# Patient Record
Sex: Female | Born: 1985 | Race: Black or African American | Hispanic: No | Marital: Single | Smoking: Current every day smoker
Health system: Southern US, Community
[De-identification: ages and names within clinical notes are randomized; demographics above are authoritative.]

## PROBLEM LIST (undated history)

## (undated) DIAGNOSIS — F1210 Cannabis abuse, uncomplicated: Secondary | ICD-10-CM

## (undated) DIAGNOSIS — Z789 Other specified health status: Secondary | ICD-10-CM

## (undated) HISTORY — PX: NO PAST SURGERIES: SHX2092

---

## 2009-06-01 ENCOUNTER — Emergency Department: Payer: Self-pay | Admitting: Emergency Medicine

## 2010-01-04 ENCOUNTER — Emergency Department: Payer: Self-pay | Admitting: Emergency Medicine

## 2010-04-28 ENCOUNTER — Emergency Department: Payer: Self-pay | Admitting: Emergency Medicine

## 2010-04-30 ENCOUNTER — Emergency Department: Payer: Self-pay | Admitting: Internal Medicine

## 2010-11-25 ENCOUNTER — Emergency Department: Payer: Self-pay | Admitting: Emergency Medicine

## 2011-05-29 ENCOUNTER — Emergency Department: Payer: Self-pay | Admitting: Emergency Medicine

## 2011-08-15 ENCOUNTER — Emergency Department: Payer: Self-pay | Admitting: Emergency Medicine

## 2011-10-14 ENCOUNTER — Ambulatory Visit: Payer: Self-pay | Admitting: Advanced Practice Midwife

## 2012-02-16 ENCOUNTER — Observation Stay: Payer: Self-pay

## 2012-03-18 ENCOUNTER — Observation Stay: Payer: Self-pay | Admitting: Obstetrics and Gynecology

## 2012-03-19 ENCOUNTER — Inpatient Hospital Stay: Payer: Self-pay | Admitting: Obstetrics and Gynecology

## 2012-03-19 LAB — CBC WITH DIFFERENTIAL/PLATELET
Basophil #: 0 10*3/uL (ref 0.0–0.1)
Basophil %: 0.1 %
Eosinophil #: 0 10*3/uL (ref 0.0–0.7)
HCT: 36.7 % (ref 35.0–47.0)
Lymphocyte #: 0.7 10*3/uL — ABNORMAL LOW (ref 1.0–3.6)
Lymphocyte %: 7.7 %
MCH: 31.4 pg (ref 26.0–34.0)
MCHC: 34.2 g/dL (ref 32.0–36.0)
MCV: 92 fL (ref 80–100)
Monocyte #: 0.7 x10 3/mm (ref 0.2–0.9)
Neutrophil %: 84.3 %
Platelet: 243 10*3/uL (ref 150–440)
RBC: 3.99 10*6/uL (ref 3.80–5.20)
WBC: 8.8 10*3/uL (ref 3.6–11.0)

## 2012-03-20 LAB — HEMATOCRIT: HCT: 35 % (ref 35.0–47.0)

## 2012-12-15 ENCOUNTER — Emergency Department: Payer: Self-pay | Admitting: Emergency Medicine

## 2013-03-20 IMAGING — US US OB US >=[ID] SNGL FETUS
1 series · 17 of 28 positions shown · non-contrast
Comparison: none

REASON FOR EXAM: Dating
COMMENTS:

[Series 1: us ob us >=(id) sngl fetus · 17 of 63 slices shown]
[im 1/63]
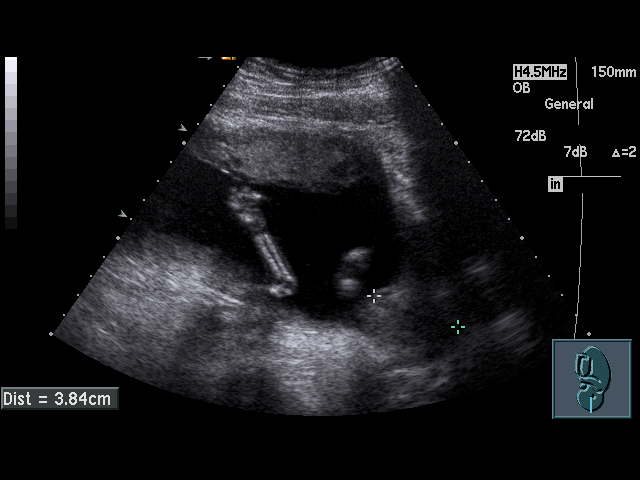
[im 5/63]
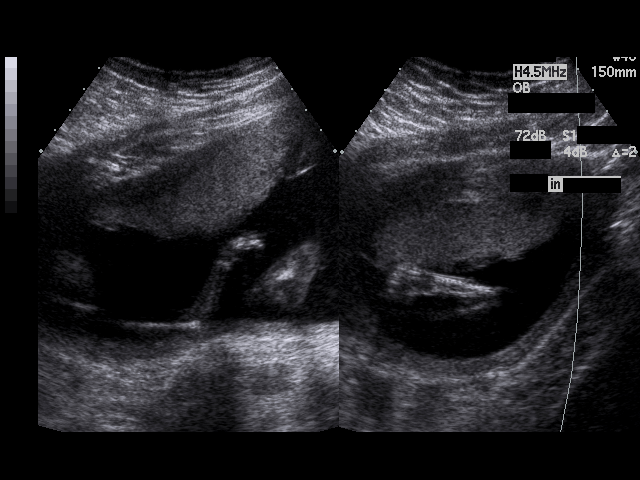
[im 10/63]
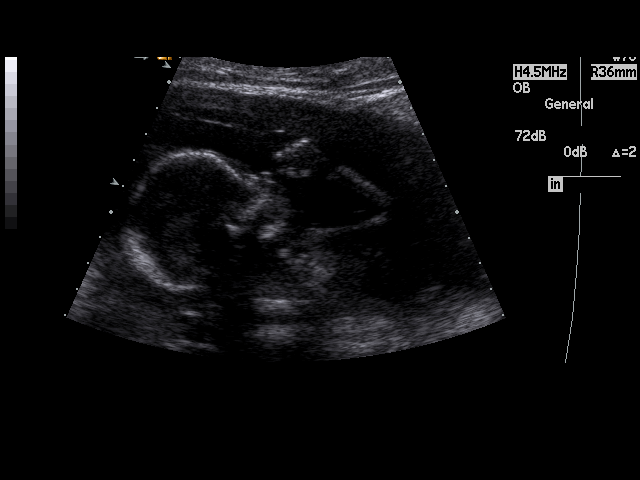
[im 12/63]
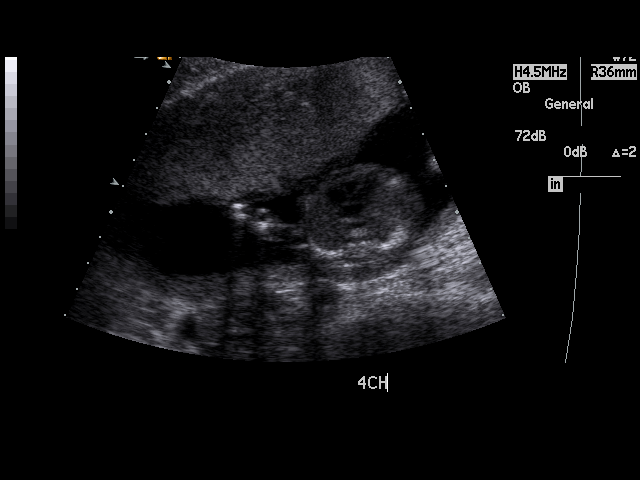
[im 17/63]
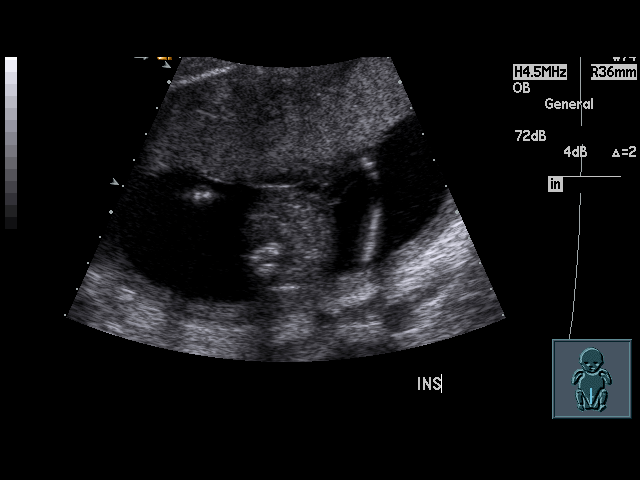
[im 21/63]
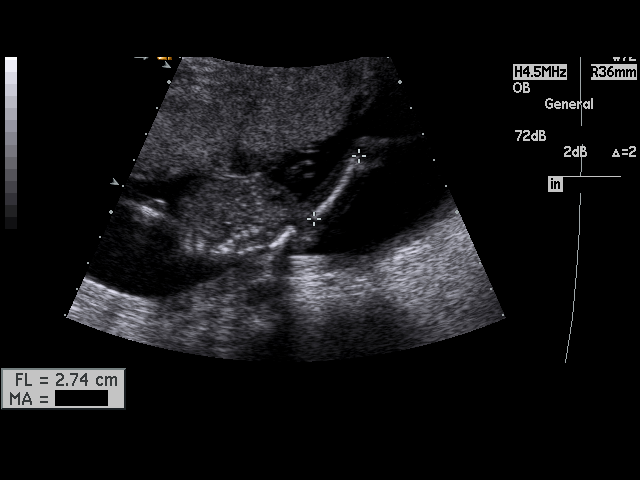
[im 23/63]
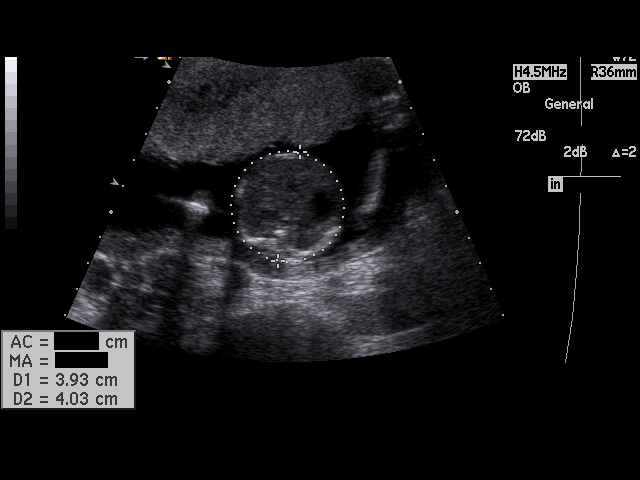
[im 28/63]
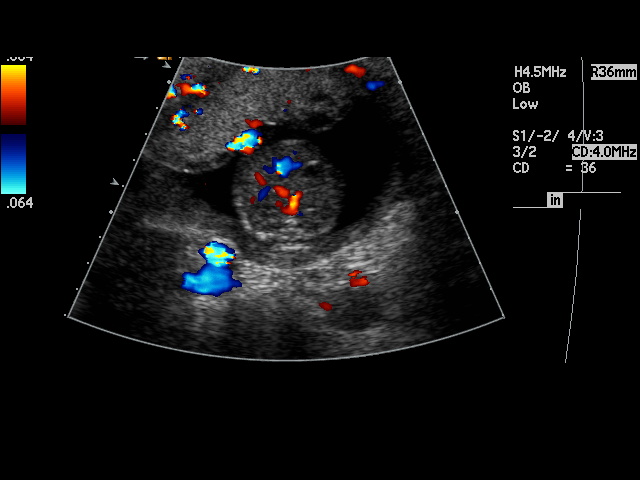
[im 33/63]
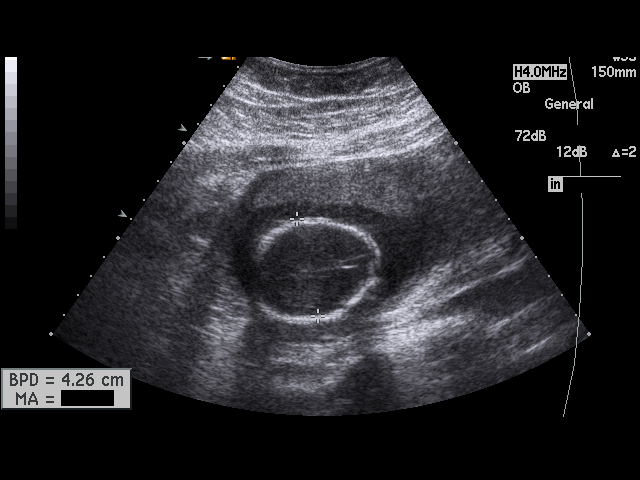
[im 35/63]
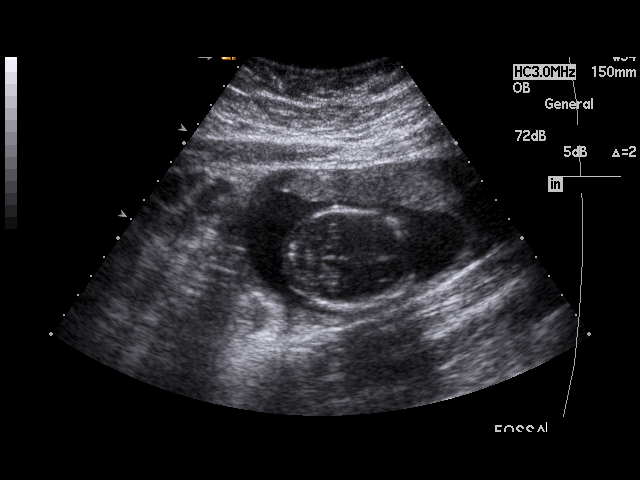
[im 40/63]
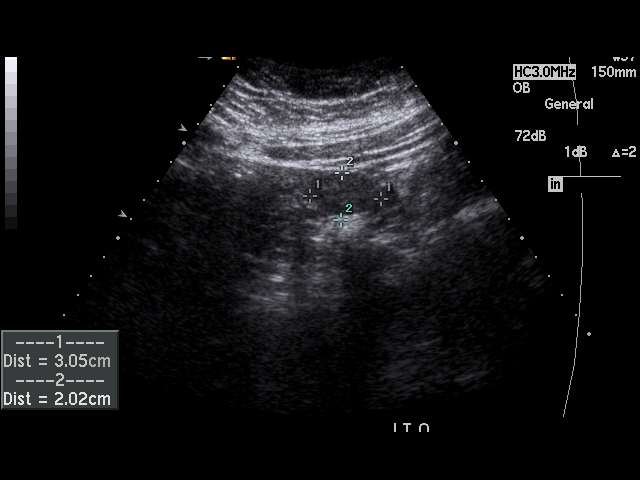
[im 42/63]
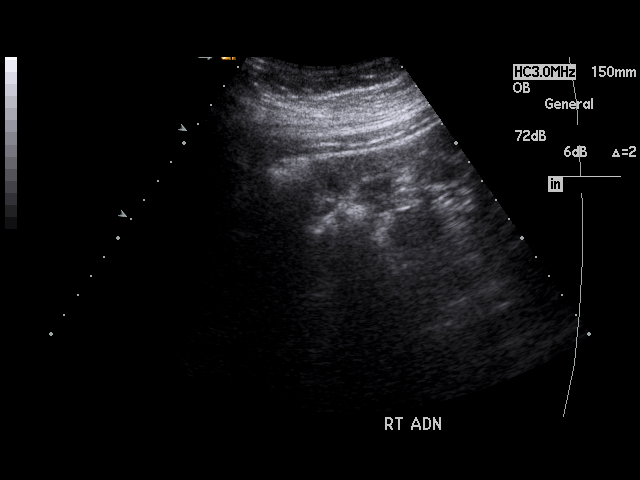
[im 46/63]
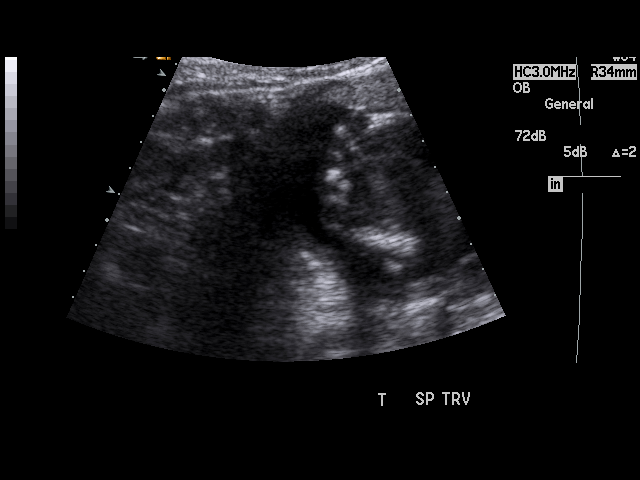
[im 51/63]
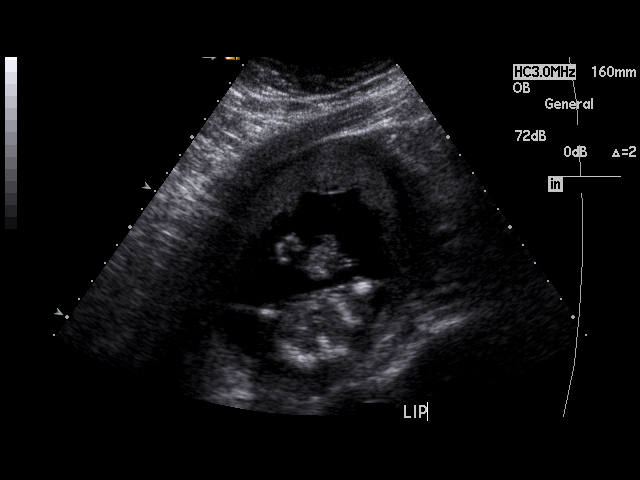
[im 53/63]
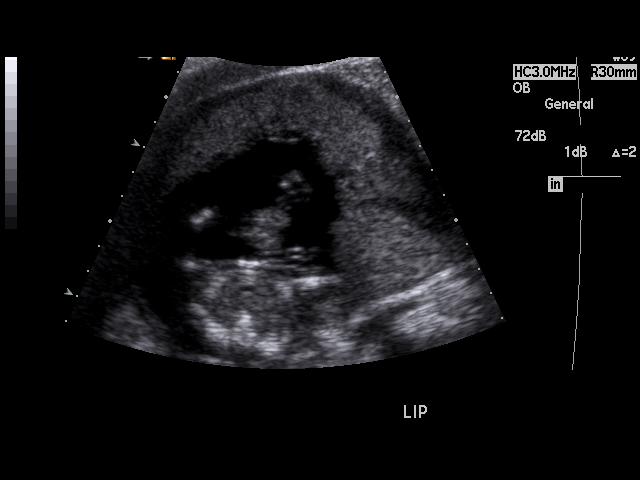
[im 58/63]
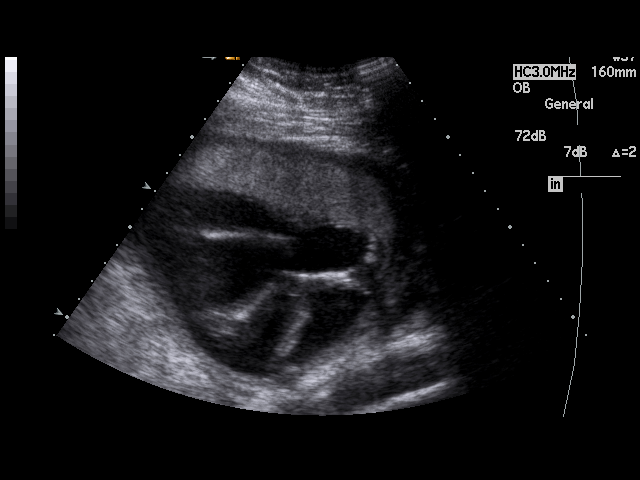
[im 63/63]
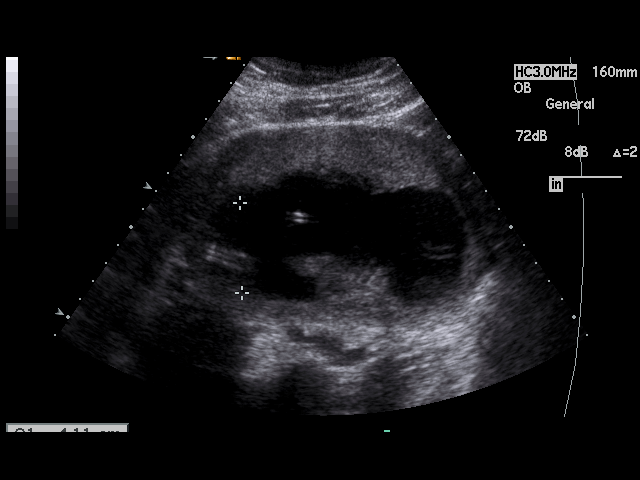

[17 of 28 positions shown; findings below may reference images not displayed]

PROCEDURE:     US  - US OB GREATER/OR EQUAL TO 7MSB9  - October 14, 2011  [DATE]

RESULT:     Findings: A single living intrauterine gestation is observed.
Presentation currently is breech. The placenta is anterior measuring 5.1 cm
from the cervical os. The fetal heart rate was monitored at 150 beats per
minute. A normal appearing three vessel cord is seen. No hydrocephalus or
hydronephrosis is noted. The bladder, kidneys, stomach, heart, diaphragm,
spine and ventricles measured no focal abnormality. A 4 chambered heart is
visualized. The RVOT and LVOT are suboptimally assess.

Fetal measurements are as follows:
BPD 4.23 cm 18 weeks 6 days
HC   15.75 cm 18 weeks 4 days
AC   12.5 cm 18 weeks 1 days
FL    2.79 cm 18 weeks 4 days

EFW equal 259 grams. Average ultrasound age is 18 weeks 4 days. Ultrasound
EDD is 03/12/2012.
IMPRESSION: See above.

## 2013-07-07 ENCOUNTER — Emergency Department: Payer: Self-pay | Admitting: Emergency Medicine

## 2013-07-07 LAB — COMPREHENSIVE METABOLIC PANEL
Albumin: 3.7 g/dL (ref 3.4–5.0)
Anion Gap: 3 — ABNORMAL LOW (ref 7–16)
Co2: 28 mmol/L (ref 21–32)
Creatinine: 1.07 mg/dL (ref 0.60–1.30)
EGFR (African American): >60
Osmolality: 275 (ref 275–301)
SGOT(AST): 21 U/L (ref 15–37)
Sodium: 138 mmol/L (ref 136–145)
Total Protein: 8.2 g/dL (ref 6.4–8.2)

## 2013-07-07 LAB — URINALYSIS, COMPLETE
Bilirubin,UR: NEGATIVE
Blood: NEGATIVE
Glucose,UR: NEGATIVE mg/dL (ref 0–75)
Nitrite: NEGATIVE
Ph: 6 (ref 4.5–8.0)
RBC,UR: <1 /HPF (ref 0–5)
Specific Gravity: 1.014 (ref 1.003–1.030)
Squamous Epithelial: 1

## 2013-07-07 LAB — CBC WITH DIFFERENTIAL/PLATELET
Basophil #: 0 10*3/uL (ref 0.0–0.1)
Basophil %: 0.5 %
Eosinophil #: 0 10*3/uL (ref 0.0–0.7)
Eosinophil %: 0.4 %
HGB: 14.1 g/dL (ref 12.0–16.0)
Lymphocyte #: 1.2 10*3/uL (ref 1.0–3.6)
Lymphocyte %: 16.9 %
MCH: 30.1 pg (ref 26.0–34.0)
MCHC: 33.3 g/dL (ref 32.0–36.0)
MCV: 90 fL (ref 80–100)
Monocyte #: 0.5 x10 3/mm (ref 0.2–0.9)
Neutrophil #: 5.4 10*3/uL (ref 1.4–6.5)
Neutrophil %: 75.7 %
Platelet: 236 10*3/uL (ref 150–440)
RBC: 4.68 10*6/uL (ref 3.80–5.20)
RDW: 13.2 % (ref 11.5–14.5)

## 2013-07-07 LAB — LIPASE, BLOOD: Lipase: 170 U/L (ref 73–393)

## 2014-06-22 ENCOUNTER — Emergency Department: Payer: Self-pay | Admitting: Student

## 2014-12-03 NOTE — H&P (Signed)
L&D Evaluation:  History:   HPI 29yo G1 at 8082w1d by D=20wk U/S presents with c/o ctx.  Scheduled for induction for postdates this evening.  Overnight pt has had regular contractions and made cervical change from 1 to 3cm.  Recent leakage of meconium stained fluid.    Presents with contractions    Patient's Medical History No Chronic Illness    Patient's Surgical History none    Medications Pre Natal Vitamins    Allergies NKDA    Social History tobacco  1ppd    Family History Non-Contributory   ROS:   ROS All systems were reviewed.  HEENT, CNS, GI, GU, Respiratory, CV, Renal and Musculoskeletal systems were found to be normal.   Exam:   Vital Signs stable    General mild distress with contractions    Mental Status clear    Chest clear    Heart normal sinus rhythm    Abdomen gravid, non-tender    Estimated Fetal Weight Average for gestational age    Pelvic cervix 3cm    Mebranes Ruptured    Description green/meconium    FHT normal rate with no decels    FHT Description 130 mod variability, + accels, no decels    Ucx regular   Impression:   Impression early labor, near post-dates   Plan:   Plan EFM/NST, monitor contractions and for cervical change, antibiotics for GBBS prophylaxis, augment as needed, pain meds/epidural prn pain   Electronic Signatures: Orvan FalconerStansbury Clipp, Dawson Hollman K (MD)  (Signed 25-Aug-13 08:01)  Authored: L&D Evaluation   Last Updated: 25-Aug-13 08:01 by Garnette GunnerStansbury Clipp, Ali LoweEryn K (MD)

## 2019-07-27 NOTE — L&D Delivery Note (Signed)
Delivery Note At 3:12 PM a  viable female was delivered via Vaginal, Spontaneous (Presentation: ROA).  APGAR: 8, 9; weight 5 lb 2.9 oz (2350 g).   Placenta status: Spontaneous, Intact.  Cord: 3 vessels with the following complications: None.  Cord pH: N/A  Anesthesia: None Episiotomy: None Lacerations: None Suture Repair: N/A Est. Blood Loss (mL):   Mom to postpartum.  Baby to Couplet care / Skin to Skin.  Vena Austria 07/12/2020, 4:15 PM

## 2019-09-14 ENCOUNTER — Ambulatory Visit: Payer: Self-pay

## 2020-07-12 ENCOUNTER — Other Ambulatory Visit: Payer: Self-pay

## 2020-07-12 ENCOUNTER — Inpatient Hospital Stay
Admission: EM | Admit: 2020-07-12 | Discharge: 2020-07-14 | DRG: 805 | Disposition: A | Discharge status: Home / Self Care | Payer: Medicaid Other | Attending: Obstetrics and Gynecology | Admitting: Obstetrics and Gynecology

## 2020-07-12 ENCOUNTER — Encounter: Payer: Self-pay | Admitting: Emergency Medicine

## 2020-07-12 ENCOUNTER — Emergency Department: Payer: Medicaid Other

## 2020-07-12 DIAGNOSIS — R109 Unspecified abdominal pain: Secondary | ICD-10-CM | POA: Diagnosis not present

## 2020-07-12 DIAGNOSIS — Z3A31 31 weeks gestation of pregnancy: Secondary | ICD-10-CM

## 2020-07-12 DIAGNOSIS — O99334 Smoking (tobacco) complicating childbirth: Secondary | ICD-10-CM | POA: Diagnosis present

## 2020-07-12 DIAGNOSIS — F17210 Nicotine dependence, cigarettes, uncomplicated: Secondary | ICD-10-CM | POA: Diagnosis present

## 2020-07-12 DIAGNOSIS — Z20822 Contact with and (suspected) exposure to covid-19: Secondary | ICD-10-CM | POA: Diagnosis present

## 2020-07-12 HISTORY — DX: Other specified health status: Z78.9

## 2020-07-12 HISTORY — DX: Cannabis abuse, uncomplicated: F12.10

## 2020-07-12 LAB — CBC WITH DIFFERENTIAL/PLATELET
Abs Immature Granulocytes: 0.03 10*3/uL (ref 0.00–0.07)
Basophils Absolute: 0 10*3/uL (ref 0.0–0.1)
Basophils Relative: 0 %
Eosinophils Absolute: 0 10*3/uL (ref 0.0–0.5)
Eosinophils Relative: 0 %
HCT: 37.8 % (ref 36.0–46.0)
Hemoglobin: 12.9 g/dL (ref 12.0–15.0)
Immature Granulocytes: 0 %
Lymphocytes Relative: 7 %
Lymphs Abs: 0.5 10*3/uL — ABNORMAL LOW (ref 0.7–4.0)
MCH: 32.2 pg (ref 26.0–34.0)
MCHC: 34.1 g/dL (ref 30.0–36.0)
MCV: 94.3 fL (ref 80.0–100.0)
Monocytes Absolute: 0.6 10*3/uL (ref 0.1–1.0)
Monocytes Relative: 8 %
Neutro Abs: 6 10*3/uL (ref 1.7–7.7)
Neutrophils Relative %: 85 %
Platelets: 101 10*3/uL — ABNORMAL LOW (ref 150–400)
RBC: 4.01 MIL/uL (ref 3.87–5.11)
RDW: 12.5 % (ref 11.5–15.5)
WBC: 7.1 10*3/uL (ref 4.0–10.5)
nRBC: 0 % (ref 0.0–0.2)

## 2020-07-12 LAB — URINALYSIS, COMPLETE (UACMP) WITH MICROSCOPIC
Bacteria, UA: NONE SEEN
Bilirubin Urine: NEGATIVE
Glucose, UA: NEGATIVE mg/dL
Ketones, ur: 80 mg/dL — AB
Nitrite: NEGATIVE
Protein, ur: >=300 mg/dL — AB
Specific Gravity, Urine: 1.02 (ref 1.005–1.030)
pH: 5 (ref 5.0–8.0)

## 2020-07-12 LAB — URINE DRUG SCREEN, QUALITATIVE (ARMC ONLY)
Amphetamines, Ur Screen: NOT DETECTED
Barbiturates, Ur Screen: NOT DETECTED
Benzodiazepine, Ur Scrn: NOT DETECTED
Cannabinoid 50 Ng, Ur ~~LOC~~: POSITIVE — AB
Cocaine Metabolite,Ur ~~LOC~~: POSITIVE — AB
MDMA (Ecstasy)Ur Screen: NOT DETECTED
Methadone Scn, Ur: NOT DETECTED
Opiate, Ur Screen: NOT DETECTED
Phencyclidine (PCP) Ur S: NOT DETECTED
Tricyclic, Ur Screen: NOT DETECTED

## 2020-07-12 LAB — COMPREHENSIVE METABOLIC PANEL
ALT: 14 U/L (ref 0–44)
AST: 24 U/L (ref 15–41)
Albumin: 3 g/dL — ABNORMAL LOW (ref 3.5–5.0)
Alkaline Phosphatase: 116 U/L (ref 38–126)
Anion gap: 11 (ref 5–15)
BUN: <5 mg/dL — ABNORMAL LOW (ref 6–20)
CO2: 24 mmol/L (ref 22–32)
Calcium: 8.8 mg/dL — ABNORMAL LOW (ref 8.9–10.3)
Chloride: 99 mmol/L (ref 98–111)
Creatinine, Ser: 0.55 mg/dL (ref 0.44–1.00)
GFR, Estimated: >60 mL/min (ref >60)
Glucose, Bld: 116 mg/dL — ABNORMAL HIGH (ref 70–99)
Potassium: 3.6 mmol/L (ref 3.5–5.1)
Sodium: 134 mmol/L — ABNORMAL LOW (ref 135–145)
Total Bilirubin: 0.7 mg/dL (ref 0.3–1.2)
Total Protein: 7.3 g/dL (ref 6.5–8.1)

## 2020-07-12 LAB — RESP PANEL BY RT-PCR (FLU A&B, COVID) ARPGX2
Influenza A by PCR: NEGATIVE
Influenza B by PCR: NEGATIVE
SARS Coronavirus 2 by RT PCR: NEGATIVE

## 2020-07-12 LAB — GROUP B STREP BY PCR: Group B strep by PCR: POSITIVE — AB

## 2020-07-12 LAB — POCT PREGNANCY, URINE: Preg Test, Ur: POSITIVE — AB

## 2020-07-12 LAB — RAPID HIV SCREEN (HIV 1/2 AB+AG)
HIV 1/2 Antibodies: NONREACTIVE
HIV-1 P24 Antigen - HIV24: NONREACTIVE

## 2020-07-12 LAB — OB RESULTS CONSOLE HIV ANTIBODY (ROUTINE TESTING): HIV: NONREACTIVE

## 2020-07-12 LAB — HEPATITIS B SURFACE ANTIGEN: Hepatitis B Surface Ag: NONREACTIVE

## 2020-07-12 LAB — TYPE AND SCREEN
ABO/RH(D): O POS
Antibody Screen: NEGATIVE

## 2020-07-12 LAB — ABO/RH: ABO/RH(D): O POS

## 2020-07-12 LAB — OB RESULTS CONSOLE GBS: GBS: POSITIVE

## 2020-07-12 LAB — OB RESULTS CONSOLE HEPATITIS B SURFACE ANTIGEN: Hepatitis B Surface Ag: NEGATIVE

## 2020-07-12 MED ORDER — OXYTOCIN 10 UNIT/ML IJ SOLN
INTRAMUSCULAR | Status: AC
  Administered 2020-07-12: 10 [IU] via INTRAMUSCULAR
  Filled 2020-07-12: qty 2

## 2020-07-12 MED ORDER — IBUPROFEN 600 MG PO TABS
600.0000 mg | ORAL_TABLET | Freq: Four times a day (QID) | ORAL | Status: DC
  Administered 2020-07-12 – 2020-07-14 (×9): 600 mg via ORAL
  Filled 2020-07-12 (×7): qty 1

## 2020-07-12 MED ORDER — SIMETHICONE 80 MG PO CHEW: 80.0000 mg | CHEWABLE_TABLET | ORAL | Status: DC | PRN

## 2020-07-12 MED ORDER — WITCH HAZEL-GLYCERIN EX PADS: 1.0000 "application " | MEDICATED_PAD | CUTANEOUS | Status: DC | PRN

## 2020-07-12 MED ORDER — DIPHENHYDRAMINE HCL 25 MG PO CAPS: 25.0000 mg | ORAL_CAPSULE | Freq: Four times a day (QID) | ORAL | Status: DC | PRN

## 2020-07-12 MED ORDER — MISOPROSTOL 200 MCG PO TABS
ORAL_TABLET | ORAL | Status: AC
  Filled 2020-07-12: qty 4

## 2020-07-12 MED ORDER — PRENATAL MULTIVITAMIN CH
1.0000 | ORAL_TABLET | Freq: Every day | ORAL | Status: DC
  Administered 2020-07-13 – 2020-07-14 (×2): 1 via ORAL
  Filled 2020-07-12 (×2): qty 1

## 2020-07-12 MED ORDER — ONDANSETRON HCL 4 MG PO TABS
4.0000 mg | ORAL_TABLET | ORAL | Status: DC | PRN
  Filled 2020-07-12: qty 1

## 2020-07-12 MED ORDER — OXYCODONE-ACETAMINOPHEN 5-325 MG PO TABS: 1.0000 | ORAL_TABLET | ORAL | Status: DC | PRN

## 2020-07-12 MED ORDER — ONDANSETRON HCL 4 MG/2ML IJ SOLN: 4.0000 mg | INTRAMUSCULAR | Status: DC | PRN

## 2020-07-12 MED ORDER — TETANUS-DIPHTH-ACELL PERTUSSIS 5-2.5-18.5 LF-MCG/0.5 IM SUSY: 0.5000 mL | PREFILLED_SYRINGE | Freq: Once | INTRAMUSCULAR | Status: DC

## 2020-07-12 MED ORDER — OXYCODONE-ACETAMINOPHEN 5-325 MG PO TABS
2.0000 | ORAL_TABLET | ORAL | Status: DC | PRN
Start: 2020-07-12 — End: 2020-07-15

## 2020-07-12 MED ORDER — AMMONIA AROMATIC IN INHA
RESPIRATORY_TRACT | Status: AC
  Filled 2020-07-12: qty 10

## 2020-07-12 MED ORDER — DIBUCAINE (PERIANAL) 1 % EX OINT: 1.0000 "application " | TOPICAL_OINTMENT | CUTANEOUS | Status: DC | PRN

## 2020-07-12 MED ORDER — SENNOSIDES-DOCUSATE SODIUM 8.6-50 MG PO TABS
2.0000 | ORAL_TABLET | ORAL | Status: DC
  Administered 2020-07-13 – 2020-07-14 (×2): 2 via ORAL
  Filled 2020-07-12 (×2): qty 2

## 2020-07-12 MED ORDER — INFLUENZA VAC SPLIT QUAD 0.5 ML IM SUSY
0.5000 mL | PREFILLED_SYRINGE | INTRAMUSCULAR | Status: DC
  Filled 2020-07-12: qty 0.5

## 2020-07-12 MED ORDER — BENZOCAINE-MENTHOL 20-0.5 % EX AERO
1.0000 "application " | INHALATION_SPRAY | CUTANEOUS | Status: DC | PRN
  Administered 2020-07-14: 1 via TOPICAL
  Filled 2020-07-12: qty 56

## 2020-07-12 MED ORDER — ACETAMINOPHEN 325 MG PO TABS
650.0000 mg | ORAL_TABLET | ORAL | Status: DC | PRN
  Administered 2020-07-12: 650 mg via ORAL
  Filled 2020-07-12: qty 2

## 2020-07-12 MED ORDER — COCONUT OIL OIL: 1.0000 "application " | TOPICAL_OIL | Status: DC | PRN

## 2020-07-12 MED ORDER — LIDOCAINE HCL (PF) 1 % IJ SOLN
INTRAMUSCULAR | Status: AC
  Filled 2020-07-12: qty 30

## 2020-07-12 NOTE — Discharge Summary (Addendum)
OB Discharge Summary     Patient Name: Sharon Alexander DOB: 01-Oct-1985 MRN: [id number]  Date of admission: 07/12/2020 Delivering MD: Vena Austria Date of Delivery: 07/12/2020 Date of discharge: 07/14/2020  Admitting diagnosis: Precipitous delivery [O62.3] Intrauterine pregnancy: Unknown     Secondary diagnosis:  Active Problems:   Precipitous delivery   Single liveborn infant delivered vaginally   Postpartum care following vaginal delivery  Additional problems: No prenatal care     Discharge diagnosis: Term Pregnancy Delivered                                                                                                Post partum procedures: none  Augmentation: N/A  Complications: None  Hospital course:  Onset of Labor With Vaginal Delivery      34 y.o. yo No obstetric history on file. at Unknown was admitted in Active Labor on 07/12/2020. Patient had an uncomplicated labor course as follows:  Membrane Rupture Time/Date: 3:08 PM ,07/12/2020   Delivery Method:Vaginal, Spontaneous  Episiotomy: None  Lacerations:  None  Patient had an uncomplicated postpartum course.  She is tolerating a regular diet. Her pain is well controlled with PO medication She is ambulating and voiding without difficulty.   Patient is discharged home in stable condition on 07/14/20.  Newborn Data: Birth date:07/12/2020  Birth time:3:12 PM  Gender:Female  Living status:Living  Apgars:8 ,9  Weight:2350 g    Physical exam  Vitals:   07/13/20 1115 07/13/20 1612 07/13/20 2316 07/14/20 0815  BP: 110/61 117/74 110/69 105/75  Pulse: 83 83 77 64  Resp: 20 18 18 20   Temp: 98.2 F (36.8 C) 98 F (36.7 C) 98 F (36.7 C) 99 F (37.2 C)  TempSrc: Oral Oral Oral Oral  SpO2:   100% 100%  Weight:      Height:       General: alert, cooperative and no distress Lochia: appropriate Uterine Fundus: firm Incision: N/A DVT Evaluation: No evidence of DVT seen on physical exam. Labs: Lab  Results  Component Value Date   WBC 4.9 07/13/2020   HGB 11.9 (L) 07/13/2020   HCT 34.7 (L) 07/13/2020   MCV 94.0 07/13/2020   PLT 108 (L) 07/13/2020   CMP Latest Ref Rng & Units 07/12/2020  Glucose 70 - 99 mg/dL 07/14/2020)  BUN 6 - 20 mg/dL 409(W)  Creatinine <1(X - 1.00 mg/dL 9.14  Sodium 7.82 - 956 mmol/L 134(L)  Potassium 3.5 - 5.1 mmol/L 3.6  Chloride 98 - 111 mmol/L 99  CO2 22 - 32 mmol/L 24  Calcium 8.9 - 10.3 mg/dL 213)  Total Protein 6.5 - 8.1 g/dL 7.3  Total Bilirubin 0.3 - 1.2 mg/dL 0.7  Alkaline Phos 38 - 126 U/L 116  AST 15 - 41 U/L 24  ALT 0 - 44 U/L 14    Discharge instruction: per After Visit Summary and "Baby and Me Booklet".  After visit meds:  Allergies as of 07/14/2020   No Known Allergies     Medication List    STOP taking these medications   simethicone 125 MG chewable tablet Commonly  known as: MYLICON       Diet: routine diet  Activity: Advance as tolerated. Pelvic rest for 6 weeks.   Outpatient follow up:6 weeks Follow up Appt:No future appointments. Follow up Visit: Make appointment for 6 week postpartum/birth control counseling visit with Dr Bonney Aid  Postpartum contraception: Undecided: options reviewed  Newborn Data:  Baby Feeding: Formula Disposition:home with mother   Time spent taking care of patient including discharge planning: 30 minutes   07/14/2020 Tresea Mall, CNM

## 2020-07-12 NOTE — ED Triage Notes (Signed)
Pt arrived via POV with c/o "butt pain" Pt states it started yesterday and last night pain became more severe. Pt states her stomach feels bloated at this time. Pt states she was unable to have a BM.

## 2020-07-12 NOTE — ED Notes (Signed)
Pt c/o intermittent back pain, going from back to front every 5-8 minutes. Pt states she feels pressure in her back. Pt states that she has had large amounts of urine once or twice the past few days but denies feeling of gushing fluid. Pt states she is unaware of LMP, had some spotting in the last 6 weeks. Pt states that she does not think she is pregnant.

## 2020-07-12 NOTE — H&P (Signed)
Obstetric H&P   Chief Complaint: Constipation  Prenatal Care Provider: No prenatal care  History of Present Illness: 34 y.o. G2P1001 presenting to ER with complaints of abdominal pain and constipation.  During ER evaluation patient received diagnosis of pregnancy with emergent ultrasound [redacted]w[redacted]d IUP based on BPD.  She reports no other medical problems.  Her G1 pregnancy was an uncomplicated delivery at term. She was checked noted to be 9.5cm and vertex and precipitously delivered shortly after presenting to labor and delivery.    Review of Systems: 10 point review of systems negative unless otherwise noted in HPI  Past Medical History: Patient Active Problem List   Diagnosis Date Noted  . Precipitous delivery 07/12/2020    Past Surgical History: History reviewed. No pertinent surgical history.  Past Obstetric History: No obstetric history on file.  Past Gynecologic History:  Family History: No family history on file.  Social History: Social History   Socioeconomic History  . Marital status: Single    Spouse name: Not on file  . Number of children: Not on file  . Years of education: Not on file  . Highest education level: Not on file  Occupational History  . Not on file  Tobacco Use  . Smoking status: Not on file  . Smokeless tobacco: Not on file  Substance and Sexual Activity  . Alcohol use: Not on file  . Drug use: Not on file  . Sexual activity: Not on file  Other Topics Concern  . Not on file  Social History Narrative  . Not on file   Social Determinants of Health   Financial Resource Strain: Not on file  Food Insecurity: Not on file  Transportation Needs: Not on file  Physical Activity: Not on file  Stress: Not on file  Social Connections: Not on file  Intimate Partner Violence: Not on file    Medications: Prior to Admission medications   Not on File    Allergies: Not on File  Physical Exam: Vitals: Blood pressure (!) 132/92, pulse (!) 52,  temperature 97.6 F (36.4 C), temperature source Oral, resp. rate 18, height 5\' 6"  (1.676 m), weight 83.9 kg, last menstrual period 05/28/2020, SpO2 100 %.  General: NAD HEENT: normocephalic, anicteric Pulmonary: No increased work of breathing Cardiovascular: RRR, distal pulses 2+ Abdomen: Gravid, non-tender Genitourinary: Fetal head on perineum Extremities: no edema, erythema, or tenderness Neurologic: Grossly intact Psychiatric: mood appropriate, affect full  Labs: Results for orders placed or performed during the hospital encounter of 07/12/20 (from the past 24 hour(s))  Pregnancy, urine POC     Status: Abnormal   Collection Time: 07/12/20  2:47 PM  Result Value Ref Range   Preg Test, Ur POSITIVE (A) NEGATIVE  Urinalysis, Complete w Microscopic     Status: Abnormal   Collection Time: 07/12/20  2:51 PM  Result Value Ref Range   Color, Urine YELLOW (A) YELLOW   APPearance HAZY (A) CLEAR   Specific Gravity, Urine 1.020 1.005 - 1.030   pH 5.0 5.0 - 8.0   Glucose, UA NEGATIVE NEGATIVE mg/dL   Hgb urine dipstick SMALL (A) NEGATIVE   Bilirubin Urine NEGATIVE NEGATIVE   Ketones, ur 80 (A) NEGATIVE mg/dL   Protein, ur 07/14/20 (A) NEGATIVE mg/dL   Nitrite NEGATIVE NEGATIVE   Leukocytes,Ua TRACE (A) NEGATIVE   RBC / HPF 11-20 0 - 5 RBC/hpf   WBC, UA 6-10 0 - 5 WBC/hpf   Bacteria, UA NONE SEEN NONE SEEN   Squamous Epithelial / LPF  6-10 0 - 5   Mucus PRESENT     Assessment: 34 y.o. G2P1001 precipitous vaginal delivery    Plan: 1) Labor - delivered precipitously, postpartum orders placed  2) Fetus - delivered  3) PNL - drawn with admission labs  4) Disposition - pending  Vena Austria, MD, [facility], [facility] 07/12/2020, 3:35 PM

## 2020-07-12 NOTE — ED Provider Notes (Signed)
[facility] Emergency Department Provider Note  ____________________________________________   Event Date/Time   First MD Initiated Contact with Patient 07/12/20 1343     (approximate)  I have reviewed the triage vital signs and the nursing notes.   HISTORY  Chief Complaint Rectal Pain    HPI Sharon Alexander is a 34 y.o. female presents emergency department complaining of rectal pain.  Patient states that started yesterday and last night became more severe.  Patient states pain is going about every 30 minutes.  States feels very bloated.  She has been able to have bowel movements but very little pieces.  She also states she is urinating more frequency.  No vomiting or diarrhea.  Patient states her last period was very light.  And before that she has no idea when her last period was.    Past Medical History:  Diagnosis Date  . Drug abuse, marijuana   . Medical history non-contributory     Patient Active Problem List   Diagnosis Date Noted  . Precipitous delivery 07/12/2020    Past Surgical History:  Procedure Laterality Date  . NO PAST SURGERIES      Prior to Admission medications   Medication Sig Start Date End Date Taking? Authorizing Provider  simethicone (MYLICON) 125 MG chewable tablet Chew 125 mg by mouth every 6 (six) hours as needed for flatulence.   Yes [provider]    Allergies Patient has no known allergies.  No family history on file.  Social History Social History   Tobacco Use  . Smoking status: Current Every Day Smoker    Packs/day: 0.25  . Smokeless tobacco: Never Used  Vaping Use  . Vaping Use: Never used  Substance Use Topics  . Alcohol use: Yes    Alcohol/week: 2.0 standard drinks    Types: 2 Shots of liquor per week  . Drug use: Yes    Frequency: 7.0 times per week    Types: Marijuana    Review of Systems  Constitutional: No fever/chills Eyes: No visual changes. ENT: No sore  throat. Respiratory: Denies cough Cardiovascular: Denies chest pain Gastrointestinal: Positive abdominal pain Genitourinary: Negative for dysuria. Musculoskeletal: Negative for back pain. Skin: Negative for rash. Psychiatric: no mood changes,     ____________________________________________   PHYSICAL EXAM:  VITAL SIGNS: ED Triage Vitals  Enc Vitals Group     BP 07/12/20 1134 (!) 132/92     Pulse Rate 07/12/20 1134 (!) 52     Resp 07/12/20 1134 18     Temp 07/12/20 1132 97.6 F (36.4 C)     Temp Source 07/12/20 1132 Oral     SpO2 07/12/20 1134 100 %     Weight 07/12/20 1132 185 lb (83.9 kg)     Height 07/12/20 1132 5\' 6"  (1.676 m)     Head Circumference --      Peak Flow --      Pain Score 07/12/20 1132 10     Pain Loc --      Pain Edu? --      Excl. in GC? --     Constitutional: Alert and oriented. Well appearing and in no acute distress. Eyes: Conjunctivae are normal.  Head: Atraumatic. Nose: No congestion/rhinnorhea. Mouth/Throat: Mucous membranes are moist.   Neck:  supple no lymphadenopathy noted Cardiovascular: Normal rate, regular rhythm. Heart sounds are normal Respiratory: Normal respiratory effort.  No retractions, lungs c t a  Abd: soft tender questionable fundus noted underneath the  ribs, bs normal all 4 quad GU: deferred Musculoskeletal: FROM all extremities, warm and well perfused Neurologic:  Normal speech and language.  Skin:  Skin is warm, dry and intact. No rash noted. Psychiatric: Mood and affect are normal. Speech and behavior are normal.  ____________________________________________   LABS (all labs ordered are listed, but only abnormal results are displayed)  Labs Reviewed  CBC WITH DIFFERENTIAL/PLATELET - Abnormal; Notable for the following components:      Result Value   Platelets 101 (*)    Lymphs Abs 0.5 (*)    All other components within normal limits  COMPREHENSIVE METABOLIC PANEL - Abnormal; Notable for the following  components:   Sodium 134 (*)    Glucose, Bld 116 (*)    BUN <5 (*)    Calcium 8.8 (*)    Albumin 3.0 (*)    All other components within normal limits  URINALYSIS, COMPLETE (UACMP) WITH MICROSCOPIC - Abnormal; Notable for the following components:   Color, Urine YELLOW (*)    APPearance HAZY (*)    Hgb urine dipstick SMALL (*)    Ketones, ur 80 (*)    Protein, ur >=300 (*)    Leukocytes,Ua TRACE (*)    All other components within normal limits  URINE DRUG SCREEN, QUALITATIVE ([facility] ONLY) - Abnormal; Notable for the following components:   Cocaine Metabolite,Ur [place] POSITIVE (*)    Cannabinoid 50 Ng, Ur [place] POSITIVE (*)    All other components within normal limits  POCT PREGNANCY, URINE - Abnormal; Notable for the following components:   Preg Test, Ur POSITIVE (*)    All other components within normal limits  GROUP B STREP BY PCR  RESP PANEL BY RT-PCR (FLU A&B, COVID) ARPGX2  RAPID HIV SCREEN (HIV 1/2 AB+AG)  MEASLES/MUMPS/RUBELLA IMMUNITY  HEPATITIS B SURFACE ANTIGEN  RPR  URINE DRUG SCREEN, QUALITATIVE ([facility] ONLY)  VARICELLA ZOSTER ANTIBODY, IGG  CBC  POC URINE PREG, ED  TYPE AND SCREEN  ABO/RH  SURGICAL PATHOLOGY   ____________________________________________   ____________________________________________  RADIOLOGY  Ultrasound OB limited  ____________________________________________   PROCEDURES  Procedure(s) performed: No  Procedures    ____________________________________________   INITIAL IMPRESSION / ASSESSMENT AND PLAN / ED COURSE  Pertinent labs & imaging results that were available during my care of the patient were reviewed by me and considered in my medical decision making (see chart for details).   Patient is 34 year old female presents with concerns of abdominal pain.  And rectal pain.  See HPI.  Physical exam shows a patient to appear to be having contractions in a fundus is noted underneath the ribs  DDx: Unknown pregnancy, abdominal pain,  constipation  POC pregnancy is positive Ultrasound call to give [place] a limited OB for dating so we can send the patient upstairs to L&D.   Ultrasound limited showed a pregnancy of approximately 32 weeks with 1 cm of fluid left.  At that time patient was rushed to L&D.  L&D was informed of patient's condition.  UA and UDS were sent from the ED.  UDS shows cocaine and marijuana.   Sharon Alexander was evaluated in Emergency Department on 07/12/2020 for the symptoms described in the history of present illness. She was evaluated in the context of the global COVID-19 pandemic, which necessitated consideration that the patient might be at risk for infection with the SARS-CoV-2 virus that causes COVID-19. Institutional protocols and algorithms that pertain to the evaluation of patients at risk for COVID-19 are in a  state of rapid change based on information released by regulatory bodies including the CDC and federal and state organizations. These policies and algorithms were followed during the patient's care in the ED.    As part of my medical decision making, I reviewed the following data within the electronic MEDICAL RECORD NUMBER Nursing notes reviewed and incorporated, Labs reviewed , Old chart reviewed, Notes from prior ED visits and [place] Controlled Substance Database  ____________________________________________   FINAL CLINICAL IMPRESSION(S) / ED DIAGNOSES  Final diagnoses:  Active preterm labor, single or unspecified fetus      NEW MEDICATIONS STARTED DURING THIS VISIT:  Current Discharge Medication List       Note:  This document was prepared using Dragon voice recognition software and may include unintentional dictation errors.    Faythe Ghee, PA-C 07/12/20 Alain Honey, MD 07/13/20 [phone]

## 2020-07-13 ENCOUNTER — Encounter: Payer: Self-pay | Admitting: Obstetrics and Gynecology

## 2020-07-13 LAB — CBC
HCT: 34.7 % — ABNORMAL LOW (ref 36.0–46.0)
Hemoglobin: 11.9 g/dL — ABNORMAL LOW (ref 12.0–15.0)
MCH: 32.2 pg (ref 26.0–34.0)
MCHC: 34.3 g/dL (ref 30.0–36.0)
MCV: 94 fL (ref 80.0–100.0)
Platelets: 108 10*3/uL — ABNORMAL LOW (ref 150–400)
RBC: 3.69 MIL/uL — ABNORMAL LOW (ref 3.87–5.11)
RDW: 12.8 % (ref 11.5–15.5)
WBC: 4.9 10*3/uL (ref 4.0–10.5)
nRBC: 0 % (ref 0.0–0.2)

## 2020-07-13 LAB — CHLAMYDIA/NGC RT PCR (ARMC ONLY)
Chlamydia Tr: NOT DETECTED
N gonorrhoeae: NOT DETECTED

## 2020-07-13 LAB — OB RESULTS CONSOLE GC/CHLAMYDIA
Chlamydia: NEGATIVE
Gonorrhea: NEGATIVE

## 2020-07-13 LAB — RPR: RPR Ser Ql: NONREACTIVE

## 2020-07-13 NOTE — Clinical Social Work Maternal (Addendum)
CLINICAL SOCIAL WORK MATERNAL/CHILD NOTE  Patient Details  Name: Sharon Alexander MRN: [id number] Date of Birth: [birth year]  Date:  07/13/2020  Clinical Social Worker Initiating Note:  Pt and Baby's UDS positive for Cocaine and MJ Date/Time: Initiated:  07/13/20/1346     Child's Name:  Sharon Alexander   Biological Parents:  Mother,Father   Need for Interpreter:  None   Reason for Referral:  Current Substance Use/Substance Use During Pregnancy  (UDS positive for cocaine and MJ)   Address:  [place]  [address]    Phone number:  [phone] (home)     Additional phone number: [phone]  Household Members/Support Persons (HM/SP):   Household Member/Support Person 1,Household Member/Support Person 2   HM/SP Name Relationship DOB or Age  HM/SP -[place] paternal grandmother Blima Dessert  HM/SP -2 Father of baby      HM/SP -3        HM/SP -4        HM/SP -5        HM/SP -6        HM/SP -7        HM/SP -8          Natural Supports (not living in the home):  Nurse, mental health Supports:     Employment: Animator   Type of Work: Works at [employer] in the [employer]   Education:  [employer] school graduate   Homebound arranged:    Museum/gallery curator Resources:  Medicaid   Other Resources:      Cultural/Religious Considerations Which May Impact Care:  none identified  Strengths:  Ability to meet basic needs ,Understanding of illness   Psychotropic Medications:         Pediatrician:       Pediatrician List:   [facility]      Pediatrician Fax Number: Marena Chancy which pediatrician she wants to use.  [facility] list of pediatrician provided  Risk Factors/Current Problems:  Substance Use    Cognitive State:  Alert    Mood/Affect:  Sad    CSW Assessment: CSW met with patient at bedside due to patient and baby's UDS positive for  cocaine and MJ. CSW explain HIPPA. Social worker explained to the patient reason for the consult.   Patient is a 34 year old female who gave birth to a baby girl on 07/12/2020. Patient reported that she is doing well but was surprise when ED attending told her she was in labor. CSW discussed the need for treatment during pregnancy and follow-up care after pregnancy. Patient reported that she did not know she was pregnant.  Patient reported that she was having regular menstrual cycles and when she stop having menstrual cycles she associated it with being constipated or "I thought I was having intestinal problems."  Noted that she went to emergency department where they informed her that she was dilated and about to have a child. Patient reported that "If I knew I was pregnant I would not have used."   Patient denied mental history or history of substance use treatment. Patient admitted to using cocaine and marijuana during pregnancy.  CSW explained to the patient that an [facility] DSS/CPS report will be made today due to positive cocaine and marijuana in her system and also explained that the baby's cord tested positive for both cocaine and marijuana.  Patient reported that the father of her newborn is Guido Sander.   Baby Halo Truman Hayward will be living with Mrs. Chapman Fitch, Johnnye Sima, paternal grandmother, and 70 year old sister, Alla German.   Psychoeducation on substance abuse.  [place] resources provided for substance abuse treatment.  Psychoeducation about Postpartum depression. SW explained to the patient that feelings or thoughts of self-harm and harming her baby, extreme fear about baby's wellbeing, not accepting help from support, not wanting to engage in activities or leave the home, not wanting to care for baby, or feeling you are the only one who can care for baby are all symptoms of postpartum depression. SW encouraged mother of baby to asked for help/purport by consulting her  primary care provider and or talking to her supports about what is happening.  Postpartum: first 2 weeks after delivery emotions may be up and down due hormones rebalancing. If symptoms occur after that 2 week it could be PPD  Patient reported that the home is being set up for her newborn. Does not have a car seat, however patient stated that she called a friend. A friend is going to deliver a car seat to her bedside at [facility] on Monday morning 07/14/2020 between 10-12 noon.    CSW Plan/Description:  Child Protective Service Report     Berenice Bouton, LCSW 07/13/2020, 1:50 PM

## 2020-07-13 NOTE — Progress Notes (Signed)
Admit Date: 07/12/2020 Today's Date: 07/13/2020  Post Partum Day 1  Subjective:  no complaints, up ad lib, voiding and tolerating PO  Objective: Temp:  [97.2 F (36.2 C)-98.8 F (37.1 C)] 98.5 F (36.9 C) (12/19 0809) Pulse Rate:  [52-107] 72 (12/19 0809) Resp:  [14-20] 18 (12/19 0809) BP: (97-132)/(59-92) 107/61 (12/19 0809) SpO2:  [98 %-100 %] 100 % (12/19 0809) Weight:  [83.9 kg-104.3 kg] 104.3 kg (12/18 1606)  Physical Exam:  General: alert, cooperative and no distress Lochia: appropriate Uterine Fundus: firm Incision: none DVT Evaluation: No evidence of DVT seen on physical exam.  Recent Labs    07/12/20 1632 07/13/20 0540  HGB 12.9 11.9*  HCT 37.8 34.7*    Assessment/Plan: Bottle Feeding and Infant doing well PNV encouraged Monitor for s/sx PPD, bleeding, breast engorgement Contraception, still considering her options   LOS: 1 day   Letitia Libra [facility] 07/13/2020, 10:21 AM

## 2020-07-14 ENCOUNTER — Ambulatory Visit: Payer: Self-pay

## 2020-07-14 LAB — MEASLES/MUMPS/RUBELLA IMMUNITY
Mumps IgG: 44.5 AU/mL (ref >10.9)
Rubella: 12.6 index (ref >0.99)
Rubeola IgG: 96.6 AU/mL (ref >16.4)

## 2020-07-14 LAB — VARICELLA ZOSTER ANTIBODY, IGG: Varicella IgG: 1132 index (ref >165)

## 2020-07-14 NOTE — Lactation Note (Signed)
This note was copied from a baby's chart. Lactation Consultation Note  Patient Name: Sharon Alexander Today's Date: 07/14/2020   When went in to discuss formula preparation and how to dry up milk, mom reports never wanting to give formula but knew she had to right now because she and baby were positive for MJ & Cocaine at delivery.  Mom did not know she was pregnant and states she would have never used Cocaine if she knew she was pregnant.  Discussed pumping and dumping until she was retested and received a negative test.  She states she plans to call [facility] tomorrow and see if she can get a DEBP through them.  DEBP kit given reviewing how to use electric and manual pump discussing hand expression, pumping, collection, storage, cleaning, labeling and handling of expressed milk.  Lactation name and number given encouraging mom to call with any questions, concerns or assistance.  Maternal Data    Feeding    LATCH Score                   Interventions    Lactation Tools Discussed/Used     Consult Status      Sharon Alexander 07/14/2020, 9:37 PM

## 2020-07-14 NOTE — Discharge Instructions (Signed)

## 2020-07-14 NOTE — Progress Notes (Signed)
All discharge instructions reviewed with pt; pt knows when to make follow up appointment and when to call MD if she has any concerns/questions; pt in wheelchair (baby in car seat and car seat on her lap); RN escorted pt and father of baby and baby to medical mall; pt going home

## 2020-07-14 NOTE — TOC Progression Note (Addendum)
Transition of Care [facility]) - Progression Note    Patient Details  Name: Sharon Alexander MRN: [id number] Date of Birth: 19-Jan-1986  [facility] CM/SW Contact  Gillett Cellar, RN Phone Number: 07/14/2020, 2:02 PM  Clinical Narrative:     Spoke to patient and discussed (+) UDS for mother and infant requiring CPS consult. Patient very pleasant and cooperative stating she had spoken to CPS worker earlier today. States they have made a safety plan and will follow back up with her in 2 weeks. Patient very remorseful regarding drug use stating she would never do drugs had she known she was pregnant. Patient states she is not addicted to drugs and rarely does them except for occasional parties. Patient lives with her daughter, boyfriend, his brother and her boyfriends mother. Patients mother also lives down the street. Strong support system. Patient states her boyfriend is out buying car seat and supplies for infant today. MOB completed Medicaid application as well as WIC application. Working with case worker from health department. Patient was hoping to breast feed however LC had advised her to wait until drugs had cleared from her system. Infant will be patient of [facility]. Patient reports infant will be discharged home with her once completed hearing test. 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 Patient states she is still in shock with new baby but she is excited and eager to get home. Patient states she will make follow up appointment for herself after discharge, No other needs or concerns at this time.         Expected Discharge Plan and Services           Expected Discharge Date: 07/14/20                                     Social Determinants of Health (SDOH) Interventions    Readmission Risk Interventions No flowsheet data found.

## 2020-07-15 LAB — SURGICAL PATHOLOGY

## 2020-08-18 ENCOUNTER — Other Ambulatory Visit: Payer: Self-pay

## 2020-08-18 ENCOUNTER — Ambulatory Visit (INDEPENDENT_AMBULATORY_CARE_PROVIDER_SITE_OTHER): Payer: Medicaid Other | Admitting: Obstetrics and Gynecology

## 2020-08-18 ENCOUNTER — Other Ambulatory Visit (HOSPITAL_COMMUNITY)
Admission: RE | Admit: 2020-08-18 | Discharge: 2020-08-18 | Disposition: A | Discharge status: Home / Self Care | Payer: Medicaid Other | Source: Ambulatory Visit | Attending: Obstetrics and Gynecology | Admitting: Obstetrics and Gynecology

## 2020-08-18 ENCOUNTER — Encounter: Payer: Self-pay | Admitting: Obstetrics and Gynecology

## 2020-08-18 DIAGNOSIS — Z124 Encounter for screening for malignant neoplasm of cervix: Secondary | ICD-10-CM

## 2020-08-18 DIAGNOSIS — Z3046 Encounter for surveillance of implantable subdermal contraceptive: Secondary | ICD-10-CM

## 2020-08-18 NOTE — Progress Notes (Signed)
Postpartum Visit  Chief Complaint:  Chief Complaint  Patient presents with  . Post-op Follow-up    6 wk postpartum - RM 4    History of Present Illness: Patient is a 35 y.o. G2P1002 presents for postpartum visit.  Date of delivery: 07/12/2020 Type of delivery: Vaginal delivery - Vacuum or forceps assisted  no Episiotomy No.  Laceration: no  Pregnancy or labor problems:  No prenatal care, cocaine and THC positive on admission  Any problems since the delivery:  no  Newborn Details:  SINGLETON :  1. BabyGender female. Birth weight: 5 lbs 2.9oz Maternal Details:  Breast or formula feeding: plans to bottle feed Any bowel or bladder issues: No  Post partum depression/anxiety noted:  no Edinburgh Post-Partum Depression Score:2 Date of last PAP: collected today  Review of Systems: Review of Systems  Constitutional: Negative.   Gastrointestinal: Negative.   Genitourinary: Negative.   Psychiatric/Behavioral: Negative.     The following portions of the patient's history were reviewed and updated as appropriate: allergies, current medications, past family history, past medical history, past social history, past surgical history and problem list.  Past Medical History:  Past Medical History:  Diagnosis Date  . Drug abuse, marijuana   . Medical history non-contributory     Past Surgical History:  Past Surgical History:  Procedure Laterality Date  . NO PAST SURGERIES      Family History:  History reviewed. No pertinent family history.  Social History:  Social History   Socioeconomic History  . Marital status: Single    Spouse name: Not on file  . Number of children: Not on file  . Years of education: Not on file  . Highest education level: Not on file  Occupational History  . Occupation: Financial risk analyst    Comment: [employer]  Tobacco Use  . Smoking status: Current Every Day Smoker    Packs/day: 0.25  . Smokeless tobacco: Never Used  Vaping Use  . Vaping  Use: Never used  Substance and Sexual Activity  . Alcohol use: Yes    Alcohol/week: 2.0 standard drinks    Types: 2 Shots of liquor per week  . Drug use: Yes    Frequency: 7.0 times per week    Types: Marijuana  . Sexual activity: Yes  Other Topics Concern  . Not on file  Social History Narrative  . Not on file   Social Determinants of Health   Financial Resource Strain: Not on file  Food Insecurity: Not on file  Transportation Needs: Not on file  Physical Activity: Not on file  Stress: Not on file  Social Connections: Not on file  Intimate Partner Violence: Not on file    Allergies:  No Known Allergies  Medications: Prior to Admission medications   Not on File    Physical Exam Blood pressure 128/84, height 5\' 6"  (1.676 m), weight 216 lb (98 kg), unknown if currently breastfeeding.  General: NAD HEENT: normocephalic, anicteric Pulmonary: No increased work of breathing Abdomen: NABS, soft, non-tender, non-distended.  Umbilicus without lesions.  No hepatomegaly, splenomegaly or masses palpable. No evidence of hernia. Genitourinary:  External: Normal external female genitalia.  Normal urethral meatus, normal  Bartholin's and Skene's glands.    Vagina: Normal vaginal mucosa, no evidence of prolapse.    Cervix: Grossly normal in appearance, no bleeding  Uterus: Non-enlarged, mobile, normal contour.  No CMT  Adnexa: ovaries non-enlarged, no adnexal masses  Rectal: deferred Extremities: no edema, erythema, or tenderness Neurologic:  Grossly intact Psychiatric: mood appropriate, affect full   Edinburgh Postnatal Depression Scale - 08/18/20 0913      Edinburgh Postnatal Depression Scale:  In the Past 7 Days   I have been able to laugh and see the funny side of things. 0    I have looked forward with enjoyment to things. 0    I have blamed myself unnecessarily when things went wrong. 0    I have been anxious or worried for no good reason. 0    I have felt scared or  panicky for no good reason. 0    Things have been getting on top of me. 0    I have been so unhappy that I have had difficulty sleeping. 0    I have felt sad or miserable. 1    I have been so unhappy that I have been crying. 1    The thought of harming myself has occurred to me. 0    Edinburgh Postnatal Depression Scale Total 2            Edinburgh Postnatal Depression Scale - 08/18/20 0913      Edinburgh Postnatal Depression Scale:  In the Past 7 Days   I have been able to laugh and see the funny side of things. 0    I have looked forward with enjoyment to things. 0    I have blamed myself unnecessarily when things went wrong. 0    I have been anxious or worried for no good reason. 0    I have felt scared or panicky for no good reason. 0    Things have been getting on top of me. 0    I have been so unhappy that I have had difficulty sleeping. 0    I have felt sad or miserable. 1    I have been so unhappy that I have been crying. 1    The thought of harming myself has occurred to me. 0    Edinburgh Postnatal Depression Scale Total 2            GYNECOLOGY PROCEDURE NOTE  Implanon removal discussed in detail.  Risks of infection, bleeding, nerve injury all reviewed.  Patient understands risks and desires to proceed.  .  Patient is certain she wants the implanon removed and replaced.  She understands that Nexplanon is a progesterone only therapy, and that patients often patients have irregular and unpredictable vaginal bleeding or amenorrhea. She understands that other side effects are possible related to systemic progesterone, including but not limited to, headaches, breast tenderness, nausea, and irritability. While effective at preventing pregnancy long acting reversible contraceptives do not prevent transmission of sexually transmitted diseases and use of barrier methods for this purpose was discussed. The placement procedure for Nexplanon was reviewed with the patient in detail  including risks of nerve injury, infection, bleeding and injury to other muscles or tendons. She understands that the Nexplanon implant is good for 3 years and needs to be removed at the end of that time.  She understands that Nexplanon is an extremely effective option for contraception, with failure rate of <1%. This information is reviewed today and all questions were answered. Informed consent was obtained, both verbally and written.  All questions answered.  Procedure: Patient placed in dorsal supine with left arm above head, elbow flexed at 90 degrees, arm resting on examination table.  Implanon identified without problems.  Betadine scrub x3.  1 ml of 1% lidocaine injected under implanon  device without problems.  Sterile gloves applied.  Small 0.5cm incision made at distal tip of implanon device with 11 blade scalpel.  Implanon brought to incision and grasped with a small kelly clamp.  Implanon removed intact without problems.    Nexplanon removed form sterile blister packaging,  Device confirmed in needle, before inserting full length of needle, tenting up the skin as the needle was advance.  The drug eluting rod was then deployed by pulling back the slider per the manufactures recommendation.  The implant was palpable by the clinician as well as the patient.  The insertion site covered dressed with a band aid before applying  a kerlex bandage pressure dressing..Minimal blood loss was noted during the procedure.  The patientt tolerated the procedure well.   She was instructed to wear the bandage for 24 hours, call with any signs of infection.  She was given the Implanon card and instructed to have the rod removed in 3 years.  Assessment: 35 y.o. G2P1002 presenting for 6 week postpartum visit  Plan: Problem List Items Addressed This Visit   None   Visit Diagnoses    6 weeks postpartum follow-up    -  Primary   Screening for malignant neoplasm of cervix       Relevant Orders   Cytology - PAP    Encounter for removal and reinsertion of Nexplanon           1) Contraception - Education given regarding options for contraception, as well as compatibility with breast feeding if applicable.  Patient plans on Nexplanon for contraception.  2)  Pap - ASCCP guidelines and rational discussed.  ASCCP guidelines and rational discussed.  Patient opts for every 5 years screening interval  3) Patient underwent screening for postpartum depression with no signs of depression  4) No follow-ups on file.   Vena Austria, MD, [facility], [facility] 08/18/2020, 9:57 AM

## 2020-08-19 ENCOUNTER — Telehealth: Payer: Self-pay

## 2020-08-19 NOTE — Telephone Encounter (Signed)
Pt calling; was seen for 6wk pp visit yesterday; needs return to work note emailed to her.  [phone]  Pt states her first day back will be tomorrow.  Adv we do not email pts; she can p/u or I can fax it.  Pt states will p/u. Adv to ask receptionist for it.

## 2020-08-20 LAB — CYTOLOGY - PAP
Comment: NEGATIVE
Diagnosis: NEGATIVE
High risk HPV: NEGATIVE

## 2024-01-16 ENCOUNTER — Emergency Department
Admission: EM | Admit: 2024-01-16 | Discharge: 2024-01-16 | Disposition: A | Discharge status: Home / Self Care | Payer: Self-pay

## 2024-01-16 ENCOUNTER — Other Ambulatory Visit: Payer: Self-pay

## 2024-01-16 DIAGNOSIS — N390 Urinary tract infection, site not specified: Secondary | ICD-10-CM | POA: Insufficient documentation

## 2024-01-16 LAB — URINALYSIS, ROUTINE W REFLEX MICROSCOPIC
Bilirubin Urine: NEGATIVE
Glucose, UA: NEGATIVE mg/dL
Ketones, ur: NEGATIVE mg/dL
Nitrite: POSITIVE — AB
Protein, ur: 100 mg/dL — AB
Specific Gravity, Urine: 1.008 (ref 1.005–1.030)
WBC, UA: >50 WBC/hpf (ref 0–5)
pH: 5 (ref 5.0–8.0)

## 2024-01-16 MED ORDER — LIDOCAINE HCL (PF) 1 % IJ SOLN
5.0000 mL | Freq: Once | INTRAMUSCULAR | Status: AC
Start: 2024-01-16 — End: 2024-01-16
  Administered 2024-01-16: 5 mL
  Filled 2024-01-16: qty 5

## 2024-01-16 MED ORDER — CEFTRIAXONE SODIUM 1 G IJ SOLR
500.0000 mg | Freq: Once | INTRAMUSCULAR | Status: AC
  Administered 2024-01-16: 500 mg via INTRAMUSCULAR
  Filled 2024-01-16: qty 10

## 2024-01-16 MED ORDER — CEPHALEXIN 500 MG PO CAPS: 500.0000 mg | ORAL_CAPSULE | Freq: Four times a day (QID) | ORAL | 0 refills | Status: DC

## 2024-01-16 MED ORDER — HYDROCODONE-ACETAMINOPHEN 5-325 MG PO TABS: 1.0000 | ORAL_TABLET | Freq: Three times a day (TID) | ORAL | 0 refills | Status: AC | PRN

## 2024-01-16 MED ORDER — CEPHALEXIN 500 MG PO CAPS
500.0000 mg | ORAL_CAPSULE | Freq: Four times a day (QID) | ORAL | 0 refills | Status: AC
Start: 2024-01-16 — End: 2024-01-21

## 2024-01-16 NOTE — Discharge Instructions (Addendum)
 Take the prescription med as directed.  Follow-up with the health department or return to the ED for worsening symptoms as discussed.

## 2024-01-16 NOTE — ED Triage Notes (Signed)
 Pt states dysuria since this past Wednesday. Pt denies fevers, pt states UTI in past.

## 2024-01-16 NOTE — ED Provider Notes (Signed)
 [facility] Emergency Department Provider Note     Event Date/Time   First MD Initiated Contact with Patient 01/16/24 1208     (approximate)   History   Dysuria   HPI  Sharon Alexander is a 38 y.o. female presents to the ED endorsing dysuria since last week.  She denies any frank fevers.  She is endorsed previous UTIs in the past.  No flank pain, nausea, vomiting, diarrhea reported.  No abnormal vaginal bleeding or vaginal discharge reported.   Physical Exam   Triage Vital Signs: ED Triage Vitals  Encounter Vitals Group     BP 01/16/24 1108 (!) 139/93     Girls Systolic BP Percentile --      Girls Diastolic BP Percentile --      Boys Systolic BP Percentile --      Boys Diastolic BP Percentile --      Pulse Rate 01/16/24 1107 75     Resp 01/16/24 1107 18     Temp 01/16/24 1107 98.9 F (37.2 C)     Temp Source 01/16/24 1107 Oral     SpO2 01/16/24 1107 100 %     Weight 01/16/24 1206 216 lb 0.8 oz (98 kg)     Height 01/16/24 1106 5' 6 (1.676 m)     Head Circumference --      Peak Flow --      Pain Score 01/16/24 1106 0     Pain Loc --      Pain Education --      Exclude from Growth Chart --     Most recent vital signs: Vitals:   01/16/24 1107 01/16/24 1108  BP:  (!) 139/93  Pulse: 75   Resp: 18   Temp: 98.9 F (37.2 C)   SpO2: 100%     General Awake, no distress. NAD HEENT NCAT. PERRL. EOMI. No rhinorrhea. Mucous membranes are moist.  CV:  Good peripheral perfusion.  RESP:  Normal effort.  ABD:  No distention.  Soft and nontender.  Normoactive bowel sounds noted.  No CVA tenderness elicited.   ED Results / Procedures / Treatments   Labs (all labs ordered are listed, but only abnormal results are displayed) Labs Reviewed  URINALYSIS, ROUTINE W REFLEX MICROSCOPIC - Abnormal; Notable for the following components:      Result Value   Color, Urine YELLOW (*)    APPearance CLOUDY (*)    Hgb urine dipstick LARGE (*)    Protein,  ur 100 (*)    Nitrite POSITIVE (*)    Leukocytes,Ua LARGE (*)    Bacteria, UA RARE (*)    All other components within normal limits  URINE CULTURE     EKG   RADIOLOGY  No results found.   PROCEDURES:  Critical Care performed: No  Procedures   MEDICATIONS ORDERED IN ED: Medications  cefTRIAXone (ROCEPHIN) injection 500 mg (500 mg Intramuscular Given 01/16/24 1303)  lidocaine  (PF) (XYLOCAINE ) 1 % injection 5 mL (5 mLs Other Given 01/16/24 1303)     IMPRESSION / MDM / ASSESSMENT AND PLAN / ED COURSE  I reviewed the triage vital signs and the nursing notes.                              Differential diagnosis includes, but is not limited to, ovarian cyst, ovarian torsion, acute appendicitis, diverticulitis, urinary tract infection/pyelonephritis, endometriosis, bowel obstruction, colitis, renal colic, gastroenteritis,  hernia, fibroids, etc.  Patient's presentation is most consistent with acute complicated illness / injury requiring diagnostic workup.  Patient's diagnosis is consistent with lower urinary tract infection.  With recent exam and workup at this time.  She is afebrile on presentation with no intractable nausea or vomiting.  No CVA tenderness elicited.  UA does confirm moderate leukocytosis with nitrite positive urine.  Patient is stable for outpatient management patient treated with empiric dose of Rocephin in the ED and discharged with Keflex to take as directed.  Urine culture is pending at this time.  Patient is to follow up with her PCP or local urgent care as suggested, as needed or otherwise directed. Patient is given ED precautions to return to the ED for any worsening or new symptoms.   FINAL CLINICAL IMPRESSION(S) / ED DIAGNOSES   Final diagnoses:  Lower urinary tract infection, acute     Rx / DC Orders   ED Discharge Orders          Ordered    cephALEXin (KEFLEX) 500 MG capsule  4 times daily,   Status:  Discontinued        01/16/24 1249     cephALEXin (KEFLEX) 500 MG capsule  4 times daily        01/16/24 1251    HYDROcodone-acetaminophen  (NORCO/VICODIN) 5-325 MG tablet  3 times daily PRN        01/16/24 1325             Note:  This document was prepared using Dragon voice recognition software and may include unintentional dictation errors.    Loyd Candida LULLA Aldona, PA-C 01/16/24 1907    Clarine Ozell LABOR, MD 01/18/24 [phone]

## 2024-01-18 LAB — URINE CULTURE
Culture: 80000 — AB
Special Requests: NORMAL
